# Patient Record
Sex: Female | Born: 1955 | Race: Black or African American | Hispanic: No | Marital: Married | Smoking: Never smoker
Health system: Southern US, Community
[De-identification: ages and names within clinical notes are randomized; demographics above are authoritative.]

## PROBLEM LIST (undated history)

## (undated) DIAGNOSIS — I10 Essential (primary) hypertension: Secondary | ICD-10-CM

## (undated) DIAGNOSIS — E785 Hyperlipidemia, unspecified: Secondary | ICD-10-CM

## (undated) DIAGNOSIS — I6529 Occlusion and stenosis of unspecified carotid artery: Secondary | ICD-10-CM

## (undated) DIAGNOSIS — R931 Abnormal findings on diagnostic imaging of heart and coronary circulation: Secondary | ICD-10-CM

## (undated) HISTORY — PX: CHOLECYSTECTOMY: SHX55

## (undated) HISTORY — DX: Occlusion and stenosis of unspecified carotid artery: I65.29

## (undated) HISTORY — PX: LUNG BIOPSY: SHX232

## (undated) HISTORY — PX: ABDOMINAL HYSTERECTOMY: SHX81

## (undated) HISTORY — PX: TUBAL LIGATION: SHX77

## (undated) HISTORY — PX: CARDIAC DEFIBRILLATOR PLACEMENT: SHX171

---

## 2012-04-16 ENCOUNTER — Other Ambulatory Visit: Payer: Self-pay | Admitting: Orthopedic Surgery

## 2012-04-16 DIAGNOSIS — M5104 Intervertebral disc disorders with myelopathy, thoracic region: Secondary | ICD-10-CM

## 2012-04-22 ENCOUNTER — Ambulatory Visit
Admission: RE | Admit: 2012-04-22 | Discharge: 2012-04-22 | Disposition: A | Discharge status: Home / Self Care | Payer: Medicare Other | Source: Ambulatory Visit | Attending: Orthopedic Surgery | Admitting: Orthopedic Surgery

## 2012-04-22 VITALS — BP 145/75 | HR 67

## 2012-04-22 DIAGNOSIS — M5104 Intervertebral disc disorders with myelopathy, thoracic region: Secondary | ICD-10-CM

## 2012-04-22 MED ORDER — IOHEXOL 180 MG/ML  SOLN
20.0000 mL | Freq: Once | INTRAMUSCULAR | Status: AC | PRN
  Administered 2012-04-22: 20 mL via INTRATHECAL

## 2012-04-22 MED ORDER — MEPERIDINE HCL 100 MG/ML IJ SOLN
75.0000 mg | Freq: Once | INTRAMUSCULAR | Status: AC
  Administered 2012-04-22: 75 mg via INTRAMUSCULAR

## 2012-04-22 MED ORDER — ONDANSETRON HCL 4 MG/2ML IJ SOLN
4.0000 mg | Freq: Once | INTRAMUSCULAR | Status: AC
  Administered 2012-04-22: 4 mg via INTRAMUSCULAR

## 2012-04-22 MED ORDER — DIAZEPAM 5 MG PO TABS
10.0000 mg | ORAL_TABLET | Freq: Once | ORAL | Status: AC
  Administered 2012-04-22: 10 mg via ORAL

## 2016-01-23 ENCOUNTER — Emergency Department (HOSPITAL_COMMUNITY)
Admission: EM | Admit: 2016-01-23 | Discharge: 2016-01-23 | Disposition: A | Discharge status: Home / Self Care | Payer: Medicare HMO | Attending: Emergency Medicine | Admitting: Emergency Medicine

## 2016-01-23 ENCOUNTER — Encounter (HOSPITAL_COMMUNITY): Payer: Self-pay | Admitting: Emergency Medicine

## 2016-01-23 DIAGNOSIS — M79671 Pain in right foot: Secondary | ICD-10-CM | POA: Diagnosis present

## 2016-01-23 DIAGNOSIS — I10 Essential (primary) hypertension: Secondary | ICD-10-CM | POA: Diagnosis not present

## 2016-01-23 DIAGNOSIS — L84 Corns and callosities: Secondary | ICD-10-CM | POA: Insufficient documentation

## 2016-01-23 DIAGNOSIS — M79672 Pain in left foot: Secondary | ICD-10-CM

## 2016-01-23 HISTORY — DX: Essential (primary) hypertension: I10

## 2016-01-23 MED ORDER — UREA 47 % EX CREA: 1.0000 "application " | TOPICAL_CREAM | Freq: Two times a day (BID) | CUTANEOUS | 0 refills | Status: DC

## 2016-01-23 NOTE — ED Triage Notes (Signed)
Pt c/o bilateral "nerve pain" in her feet. Pt denies pmh of neuropathy. No injury reported.

## 2016-01-23 NOTE — ED Provider Notes (Signed)
[facility]-EMERGENCY DEPT Provider Note   CSN: [id number] Arrival date & time: 01/23/16  1307  By signing my name below, I, Soijett Blue, attest that this documentation has been prepared under the direction and in the presence of Burgess AmorJulie Cola Highfill, PA-C Electronically Signed: Soijett Blue, ED Scribe. 01/23/16. 3:17 PM.    History   Chief Complaint Chief Complaint  Patient presents with  . Foot Pain    HPI Donna Tran is a 60 y.o. female with a PMHx of HTN, who presents to the Emergency Department complaining of bilateral foot pain onset 2 weeks. Pt notes that she has intermittent sharp "nerve pain" without burning sensation to the ball of her bilateral feet but also endorses reduced sensation to the bottom of her feet along with scaling and constant chapped skin on her feet despite getting routine pedicures. Pt denies  alleviating factors. Pt reports that she does stand on her feet a lot.  She denies color change, wound, rash, swelling, and any other symptoms. Pt has been seen by her pcp for this and was checked for diabetes and does not have this problem. Pt PCP is Dr. Merleen MillinerWinfield in [place], [place]. 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 .   The history is provided by the patient. No language interpreter was used.    Past Medical History:  Diagnosis Date  . Hypertension     There are no active problems to display for this patient.   Past Surgical History:  Procedure Laterality Date  . ABDOMINAL HYSTERECTOMY    . CARDIAC DEFIBRILLATOR PLACEMENT    . CHOLECYSTECTOMY    . LUNG BIOPSY    . TUBAL LIGATION      OB History    No data available       Home Medications    Prior to Admission medications   Medication Sig Start Date End Date Taking? Authorizing Provider  Urea 47 % CREA Apply 1 application topically 2 (two) times daily. 01/23/16   Burgess AmorJulie Sae Handrich, PA-C    Family History No family history on  file.  Social History Social History  Substance Use Topics  . Smoking status: Never Smoker  . Smokeless tobacco: Never Used  . Alcohol use No     Allergies   Sulfa antibiotics   Review of Systems Review of Systems  Constitutional: Negative for fever.  Cardiovascular: Negative for leg swelling.  Musculoskeletal: Positive for arthralgias (bilateral feet). Negative for back pain and joint swelling.  Skin: Negative for color change, rash and wound.       Negative except as mentioned in HPI.      Physical Exam Updated Vital Signs BP 127/67 (BP Location: Left Arm)   Pulse (!) 55   Temp 97.9 F (36.6 C) (Oral)   Resp 18   Ht 5' 8.5" (1.74 m)   Wt 108.9 kg   SpO2 100%   BMI 35.96 kg/m   Physical Exam  Constitutional: She appears well-developed and well-nourished.  HENT:  Head: Atraumatic.  Neck: Normal range of motion.  Cardiovascular:  Pulses:      Dorsalis pedis pulses are 2+ on the right side, and 2+ on the left side.  Pulses equal bilaterally.  Less than 2 sec cap refill in toes.  Musculoskeletal: She exhibits tenderness.  Advanced callus formation bilaterally along the plantar metatarsals and bilateral great toe at MTP joints. skin appears chapped and hyperkeratotic. Skin is intact.  No lesions or rash between toes. Cap refill in toes less than 2 seconds. No deformity.   Feet:  Right Foot:  Skin Integrity: Positive for callus and dry skin. Negative for skin breakdown or erythema.  Left Foot:  Skin Integrity: Positive for callus and dry skin. Negative for skin breakdown or erythema.  Neurological: She is alert. She displays normal reflexes. No sensory deficit.  Skin: Skin is warm, dry and intact. Capillary refill takes less than 2 seconds. No lesion and no rash noted.  Psychiatric: She has a normal mood and affect.     ED Treatments / Results  DIAGNOSTIC STUDIES: Oxygen Saturation is 100% on RA, nl by my interpretation.    COORDINATION OF CARE: 3:09 PM  Discussed treatment plan with pt at bedside which includes referral to podiatrist and pt agreed to plan.   Procedures Procedures (including critical care time)  Medications Ordered in ED Medications - No data to display   Initial Impression / Assessment and Plan / ED Course  I have reviewed the triage vital signs and the nursing notes.   Clinical Course    Pt with bilateral foot pain of unclear etiology.  No vascular or neuro deficits on exam.  She has unusually thickened and hyperkeratotic skin plantar and along great toe mtp's.  Prescribed keratolytic agent.  Referral to podiatry for further eval.  And tx.  Final Clinical Impressions(s) / ED Diagnoses   Final diagnoses:  Foot pain, bilateral  Corns and callus    New Prescriptions Discharge Medication List as of 01/23/2016  3:23 PM    START taking these medications   Details  Urea 47 % CREA Apply 1 application topically 2 (two) times daily., Starting Mon 01/23/2016, Print        I personally performed the services described in this documentation, which was scribed in my presence. The recorded information has been reviewed and is accurate.     Burgess Amor, PA-C 01/25/16 1851    Loren Racer, MD 01/30/16 1640

## 2016-11-15 ENCOUNTER — Other Ambulatory Visit: Payer: Self-pay | Admitting: Orthopedic Surgery

## 2016-11-15 DIAGNOSIS — M25511 Pain in right shoulder: Secondary | ICD-10-CM

## 2016-11-21 ENCOUNTER — Other Ambulatory Visit: Payer: Medicare HMO

## 2016-11-27 ENCOUNTER — Other Ambulatory Visit: Payer: Medicare HMO

## 2016-12-04 ENCOUNTER — Ambulatory Visit
Admission: RE | Admit: 2016-12-04 | Discharge: 2016-12-04 | Disposition: A | Discharge status: Home / Self Care | Payer: Medicare HMO | Source: Ambulatory Visit | Attending: Orthopedic Surgery | Admitting: Orthopedic Surgery

## 2016-12-04 DIAGNOSIS — M25511 Pain in right shoulder: Secondary | ICD-10-CM

## 2017-08-03 ENCOUNTER — Other Ambulatory Visit: Payer: Self-pay

## 2017-08-03 ENCOUNTER — Emergency Department (HOSPITAL_COMMUNITY)
Admission: EM | Admit: 2017-08-03 | Discharge: 2017-08-03 | Disposition: A | Discharge status: Home / Self Care | Payer: Medicare HMO | Attending: Emergency Medicine | Admitting: Emergency Medicine

## 2017-08-03 ENCOUNTER — Encounter (HOSPITAL_COMMUNITY): Payer: Self-pay | Admitting: Emergency Medicine

## 2017-08-03 ENCOUNTER — Emergency Department (HOSPITAL_COMMUNITY): Payer: Medicare HMO

## 2017-08-03 DIAGNOSIS — R0602 Shortness of breath: Secondary | ICD-10-CM | POA: Diagnosis present

## 2017-08-03 DIAGNOSIS — Z79899 Other long term (current) drug therapy: Secondary | ICD-10-CM | POA: Insufficient documentation

## 2017-08-03 DIAGNOSIS — I10 Essential (primary) hypertension: Secondary | ICD-10-CM | POA: Insufficient documentation

## 2017-08-03 DIAGNOSIS — R0789 Other chest pain: Secondary | ICD-10-CM | POA: Insufficient documentation

## 2017-08-03 DIAGNOSIS — D649 Anemia, unspecified: Secondary | ICD-10-CM | POA: Diagnosis not present

## 2017-08-03 DIAGNOSIS — I509 Heart failure, unspecified: Secondary | ICD-10-CM | POA: Diagnosis not present

## 2017-08-03 DIAGNOSIS — R600 Localized edema: Secondary | ICD-10-CM | POA: Insufficient documentation

## 2017-08-03 DIAGNOSIS — M7989 Other specified soft tissue disorders: Secondary | ICD-10-CM | POA: Diagnosis not present

## 2017-08-03 HISTORY — DX: Abnormal findings on diagnostic imaging of heart and coronary circulation: R93.1

## 2017-08-03 HISTORY — DX: Hyperlipidemia, unspecified: E78.5

## 2017-08-03 LAB — BRAIN NATRIURETIC PEPTIDE: B Natriuretic Peptide: 227 pg/mL — ABNORMAL HIGH (ref 0.0–100.0)

## 2017-08-03 LAB — TROPONIN I: Troponin I: <0.03 ng/mL (ref <0.03)

## 2017-08-03 LAB — CBC
HCT: 33.5 % — ABNORMAL LOW (ref 36.0–46.0)
Hemoglobin: 10.7 g/dL — ABNORMAL LOW (ref 12.0–15.0)
MCH: 29.2 pg (ref 26.0–34.0)
MCHC: 31.9 g/dL (ref 30.0–36.0)
MCV: 91.5 fL (ref 78.0–100.0)
Platelets: 224 10*3/uL (ref 150–400)
RBC: 3.66 MIL/uL — ABNORMAL LOW (ref 3.87–5.11)
RDW: 14.2 % (ref 11.5–15.5)
WBC: 6.4 10*3/uL (ref 4.0–10.5)

## 2017-08-03 LAB — BASIC METABOLIC PANEL
Anion gap: 9 (ref 5–15)
BUN: 14 mg/dL (ref 6–20)
CO2: 28 mmol/L (ref 22–32)
Calcium: 8.9 mg/dL (ref 8.9–10.3)
Chloride: 103 mmol/L (ref 101–111)
Creatinine, Ser: 0.74 mg/dL (ref 0.44–1.00)
GFR calc Af Amer: >60 mL/min (ref >60)
GFR calc non Af Amer: >60 mL/min (ref >60)
Glucose, Bld: 102 mg/dL — ABNORMAL HIGH (ref 65–99)
Potassium: 3.6 mmol/L (ref 3.5–5.1)
Sodium: 140 mmol/L (ref 135–145)

## 2017-08-03 LAB — D-DIMER, QUANTITATIVE: D-Dimer, Quant: 1.27 ug/mL-FEU — ABNORMAL HIGH (ref 0.00–0.50)

## 2017-08-03 MED ORDER — RIVAROXABAN 15 MG PO TABS
15.0000 mg | ORAL_TABLET | Freq: Once | ORAL | Status: AC
  Administered 2017-08-03: 15 mg via ORAL
  Filled 2017-08-03 (×2): qty 1

## 2017-08-03 MED ORDER — FUROSEMIDE 40 MG PO TABS: 40.0000 mg | ORAL_TABLET | Freq: Every day | ORAL | 0 refills | Status: AC | PRN

## 2017-08-03 MED ORDER — FUROSEMIDE 40 MG PO TABS
20.0000 mg | ORAL_TABLET | Freq: Once | ORAL | Status: AC
  Administered 2017-08-03: 20 mg via ORAL
  Filled 2017-08-03: qty 1

## 2017-08-03 MED ORDER — IOPAMIDOL (ISOVUE-370) INJECTION 76%
100.0000 mL | Freq: Once | INTRAVENOUS | Status: AC | PRN
  Administered 2017-08-03: 100 mL via INTRAVENOUS

## 2017-08-03 NOTE — ED Provider Notes (Signed)
[facility] EMERGENCY DEPARTMENT Provider Note   CSN: [id number] Arrival date & time: 08/03/17  1755     History   Chief Complaint Chief Complaint  Patient presents with  . Shortness of Breath    HPI Donna Tran is a 62 y.o. female.  HPI Patient presents to the emergency room for evaluation of shortness of breath and leg swelling.  Patient states she has been traveling recently.  Several hours time in a car.  Over the last several days she has noticed shortness of breath.  Specifically when she is walking around or is active.  She has had some mild chest tightness off and on for the last few days.  She also noticed unilateral, not bilateral lower extremity swelling.  She has noticed in her left leg.  Today however it is a bit better.  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  Patient states that her cardiomyopathy eventually improved and she does not take any diuretics daily.  Does have a defibrillator in place.  She denies any history of PE or DVT.  She denies any fevers or coughing.  No abdominal pain or other complaints. Past Medical History:  Diagnosis Date  . Decreased cardiac ejection fraction   . Hyperlipidemia   . Hypertension     There are no active problems to display for this patient.   Past Surgical History:  Procedure Laterality Date  . ABDOMINAL HYSTERECTOMY    . CARDIAC DEFIBRILLATOR PLACEMENT    . CARDIAC DEFIBRILLATOR PLACEMENT    . CHOLECYSTECTOMY    . LUNG BIOPSY    . TUBAL LIGATION       OB History   None      Home Medications    Prior to Admission medications   Medication Sig Start Date End Date Taking? Authorizing Provider  carvedilol (COREG) 25 MG tablet Take 25 mg by mouth 2 (two) times daily. 01/24/15  Yes [provider]  cholecalciferol (VITAMIN D) 1000 units tablet Vitamin D3 1,000 unit capsule  Take 1 capsule every day by oral route as directed for 90 days. 06/07/17  Yes [provider]  ferrous  sulfate 325 (65 FE) MG EC tablet Take 325 mg by mouth daily with breakfast.    Yes [provider]  fluticasone (FLONASE) 50 MCG/ACT nasal spray Place 1 spray into both nostrils daily as needed for allergies or rhinitis.  03/19/16  Yes [provider]  hydrochlorothiazide (HYDRODIURIL) 25 MG tablet Take 25 mg by mouth daily. 08/23/16  Yes [provider]  ibuprofen (ADVIL,MOTRIN) 200 MG tablet TAKE 1 TABLET BY MOUTH EVERY 6 HOURS AS NEEDED FOR PAIN CONTROL 09/26/15  Yes [provider]  metoprolol tartrate (LOPRESSOR) 25 MG tablet Take 25 mg by mouth daily as needed. For palpitations 02/08/17  Yes [provider]  quinapril (ACCUPRIL) 40 MG tablet Take 40 mg by mouth daily.   Yes [provider]  triamcinolone cream (KENALOG) 0.1 % APPLY ONE APPLICATION TOPICALLY DAILY AS NEEDED FOR IRRITATION 06/07/17  Yes [provider]  furosemide (LASIX) 40 MG tablet Take 1 tablet (40 mg total) by mouth daily as needed for fluid. 08/03/17   Linwood Dibbles, MD    Family History No family history on file.  Social History Social History   Tobacco Use  . Smoking status: Never Smoker  . Smokeless tobacco: Never Used  Substance Use Topics  . Alcohol use: No  . Drug use: Never     Allergies  Sulfa antibiotics   Review of Systems Review of Systems  Constitutional: Negative for fever.  Gastrointestinal: Negative for blood in stool, nausea and vomiting.  Genitourinary: Negative for dysuria.  All other systems reviewed and are negative.    Physical Exam Updated Vital Signs BP (!) 138/93   Pulse 68   Resp 19   Ht 1.727 m (5\' 8" )   Wt 109.8 kg (242 lb)   SpO2 100%   BMI 36.80 kg/m   Physical Exam  Constitutional: She appears well-developed and well-nourished.  Non-toxic appearance. She does not appear ill. No distress.  HENT:  Head: Normocephalic and atraumatic.  Right Ear: External ear normal.  Left Ear: External ear normal.  Eyes:  Conjunctivae are normal. Right eye exhibits no discharge. Left eye exhibits no discharge. No scleral icterus.  Neck: Neck supple. No tracheal deviation present.  Cardiovascular: Normal rate, regular rhythm and intact distal pulses.  Pulmonary/Chest: Effort normal and breath sounds normal. No stridor. No respiratory distress. She has no wheezes. She has no rales.  Abdominal: Soft. Bowel sounds are normal. She exhibits no distension. There is no tenderness. There is no rebound and no guarding.  Musculoskeletal: Normal range of motion. She exhibits no tenderness.       Right lower leg: She exhibits no edema.       Left lower leg: She exhibits edema (mild). She exhibits no tenderness.  Neurological: She is alert. She has normal strength. No cranial nerve deficit (no facial droop, extraocular movements intact, no slurred speech) or sensory deficit. She exhibits normal muscle tone. She displays no seizure activity. Coordination normal.  Skin: Skin is warm and dry. No rash noted.  Psychiatric: She has a normal mood and affect.  Nursing note and vitals reviewed.    ED Treatments / Results  Labs (all labs ordered are listed, but only abnormal results are displayed) Labs Reviewed  BASIC METABOLIC PANEL - Abnormal; Notable for the following components:      Result Value   Glucose, Bld 102 (*)    All other components within normal limits  CBC - Abnormal; Notable for the following components:   RBC 3.66 (*)    Hemoglobin 10.7 (*)    HCT 33.5 (*)    All other components within normal limits  BRAIN NATRIURETIC PEPTIDE - Abnormal; Notable for the following components:   B Natriuretic Peptide 227.0 (*)    All other components within normal limits  D-DIMER, QUANTITATIVE (NOT AT [facility]) - Abnormal; Notable for the following components:   D-Dimer, Quant 1.27 (*)    All other components within normal limits  TROPONIN I    EKG EKG Interpretation  Date/Time:  Saturday August 03 2017 18:06:02  EDT Ventricular Rate:  75 PR Interval:    QRS Duration: 156 QT Interval:  505 QTC Calculation: 565 R Axis:   159 Text Interpretation:  Sinus rhythm Atrial premature complex Prolonged PR interval Probable left atrial enlargement Anterior infarct, old Prolonged QT interval No old tracing to compare Confirmed by Linwood Dibbles [phone]) on 08/03/2017 6:11:21 PM   Radiology Dg Chest 2 View  Result Date: 08/03/2017 CLINICAL DATA:  Chest pain and shortness of breath. EXAM: CHEST - 2 VIEW COMPARISON:  None. FINDINGS: Dual lead cardiac pacemaker with leads overlying right atrium and right ventricle. Mildly enlarged cardiac silhouette. Mediastinal contours appear intact. Interstitial pulmonary edema. Bilateral small pleural effusions. Probable underlying emphysema. Osseous structures are without acute abnormality. Soft tissues are grossly normal. IMPRESSION: Enlarged cardiac silhouette with  interstitial pulmonary edema and small bilateral pleural effusions. Probable underlying emphysema. Electronically Signed   By: Ted Mcalpineobrinka  Dimitrova M.D.   On: 08/03/2017 19:17   Ct Angio Chest Pe W And/or Wo Contrast  Result Date: 08/03/2017 CLINICAL DATA:  Shortness of breath with exertion and at rest. EXAM: CT ANGIOGRAPHY CHEST WITH CONTRAST TECHNIQUE: Multidetector CT imaging of the chest was performed using the standard protocol during bolus administration of intravenous contrast. Multiplanar CT image reconstructions and MIPs were obtained to evaluate the vascular anatomy. CONTRAST:  100mL ISOVUE-370 IOPAMIDOL (ISOVUE-370) INJECTION 76% COMPARISON:  Chest radiograph 08/03/2017 FINDINGS: Cardiovascular: Satisfactory opacification of the pulmonary arteries to the segmental level. No evidence of pulmonary embolism. Mildly enlarged heart. Dual lead cardiac pacemaker. No pericardial effusion. Mediastinum/Nodes: No enlarged mediastinal, hilar, or axillary lymph nodes. Thyroid gland, trachea, and esophagus demonstrate no significant  findings. Lungs/Pleura: Bilateral, right greater than left, lobe airspace consolidation versus atelectasis. Small bilateral pleural effusions. Upper Abdomen: No acute abnormality. Musculoskeletal: No chest wall abnormality. No acute or significant osseous findings. Review of the MIP images confirms the above findings. IMPRESSION: No evidence of pulmonary embolus or other acute vascular abnormality. Mildly enlarged heart. Small bilateral pleural effusions. Bilateral lung, right greater than left, dependent airspace consolidation versus atelectasis. Electronically Signed   By: Ted Mcalpineobrinka  Dimitrova M.D.   On: 08/03/2017 21:05    Procedures Procedures (including critical care time)  Medications Ordered in ED Medications  Rivaroxaban (XARELTO) tablet 15 mg (has no administration in time range)  furosemide (LASIX) tablet 20 mg (has no administration in time range)  iopamidol (ISOVUE-370) 76 % injection 100 mL (100 mLs Intravenous Contrast Given 08/03/17 2021)     Initial Impression / Assessment and Plan / ED Course  I have reviewed the triage vital signs and the nursing notes.  Pertinent labs & imaging results that were available during my care of the patient were reviewed by me and considered in my medical decision making (see chart for details).  Clinical Course as of Aug 04 2146  Sat Aug 03, 2017  1901 D dimer elevated.  Will CT to evaluate further   [JK]    Clinical Course User Index [JK] Linwood DibblesKnapp, Zariyah Stephens, MD    The patient presented to the emergency room for evaluation of mild shortness of breath and leg swelling.  Patient's laboratory tests were notable for an elevated d-dimer.  She had a mild anemia but I do not think this is clinically significant.  She is not having any symptoms to suggest active bleeding.  She is a slight increase in her BNP and chest x-ray suggested mild pulmonary edema.  Pt is breathing easily.  Comfortable with going home.  Will start her on oral diuretic and close follow  up with cardiologist this week.  CT scan negative for PE, but will have pt return tomorrow to have a ultrasound of her leg.  Empiric xarelto dose given in the ED Final Clinical Impressions(s) / ED Diagnoses   Final diagnoses:  Leg swelling  Anemia, unspecified type  Congestive heart failure, unspecified HF chronicity, unspecified heart failure type [facility](HCC)    ED Discharge Orders        Ordered    furosemide (LASIX) 40 MG tablet  Daily PRN     08/03/17 2146    US Venous Img Lower Unilateral Left     08/03/17 2147       Linwood DibblesKnapp, Juley Giovanetti, MD 08/03/17 2148

## 2017-08-03 NOTE — ED Triage Notes (Signed)
Pt c/o dyspnea with exertion, bilateral lower extremity edema, and chest tightness x 2-3 days. Pt with defibrillator for low EF. States she takes Lasix as needed. Denies hx of CHF.

## 2017-08-03 NOTE — Discharge Instructions (Signed)
Follow-up with your cardiologist this week, call to schedule a follow-up appointment, return to the full tomorrow morning to have the ultrasound of your leg.  Return to the emergency room as needed for worsening symptoms.  Start taking your Lasix daily for the next several days.

## 2017-08-04 ENCOUNTER — Ambulatory Visit (HOSPITAL_COMMUNITY)
Admit: 2017-08-04 | Discharge: 2017-08-04 | Disposition: A | Discharge status: Home / Self Care | Payer: Medicare HMO | Source: Ambulatory Visit | Attending: Emergency Medicine | Admitting: Emergency Medicine

## 2017-08-04 DIAGNOSIS — M7989 Other specified soft tissue disorders: Secondary | ICD-10-CM | POA: Diagnosis present

## 2017-08-04 DIAGNOSIS — R7989 Other specified abnormal findings of blood chemistry: Secondary | ICD-10-CM | POA: Insufficient documentation

## 2017-08-04 NOTE — ED Provider Notes (Signed)
  1010  patient returned today for scheduled out patient Venous US of left lower extremity.  No evidence of DVT.  Pt informed, agrees to PCP follow-up    Pauline Ausriplett, Basem Yannuzzi, PA-C 08/04/17 1014    Samuel JesterMcManus, Kathleen, DO 08/04/17 1452

## 2019-03-26 IMAGING — US US EXTREM LOW VENOUS*L*
1 series · 13 of 24 positions shown · non-contrast
Comparison: None.

CLINICAL DATA: Left lower leg pain for 3 days



[Series 1: us extrem low venous*left* · 0.07mm/px · 13 of 32 slices shown]
[im 1/32]
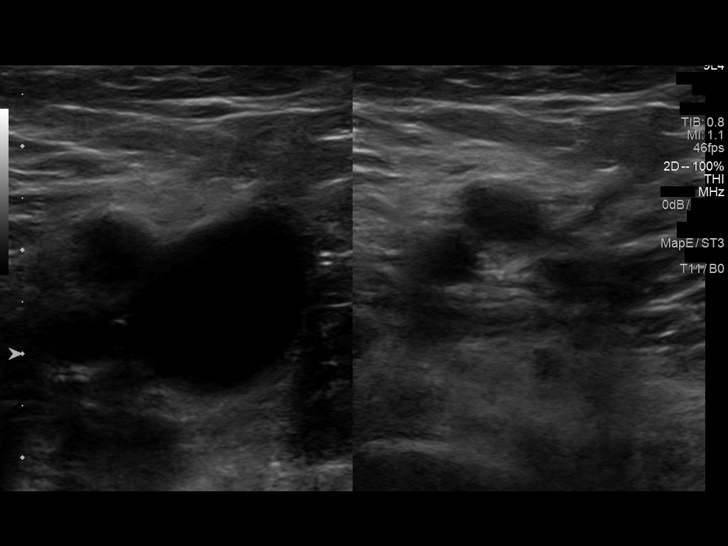
[im 3/32]
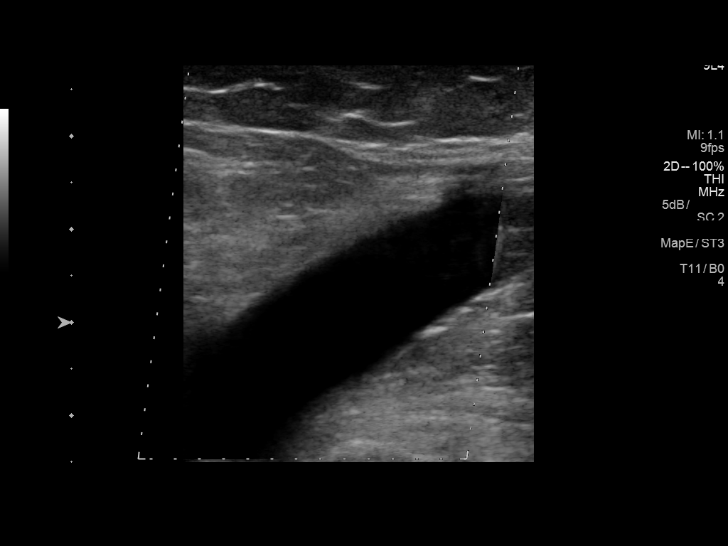
[im 6/32]
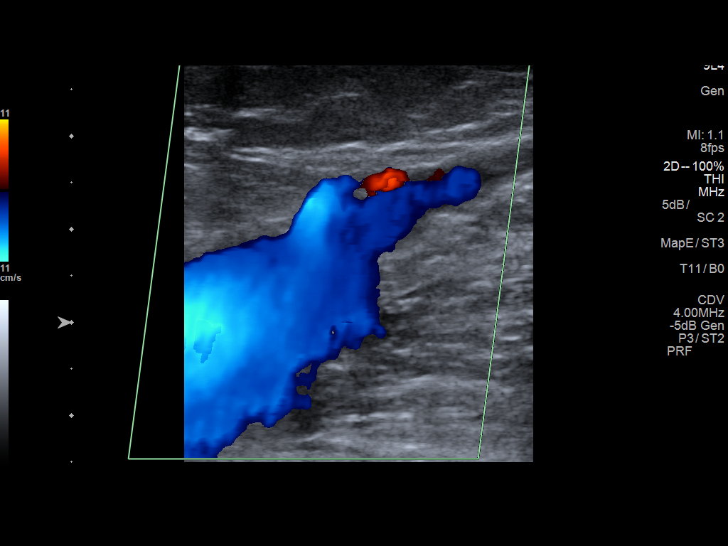
[im 9/32]
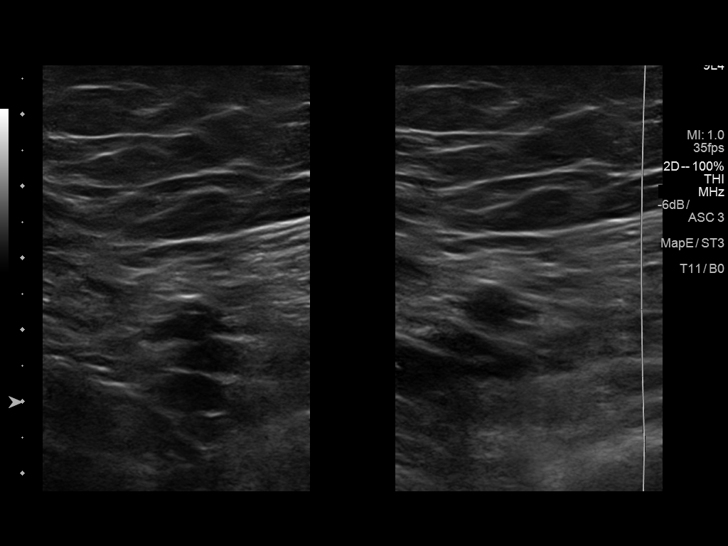
[im 11/32]
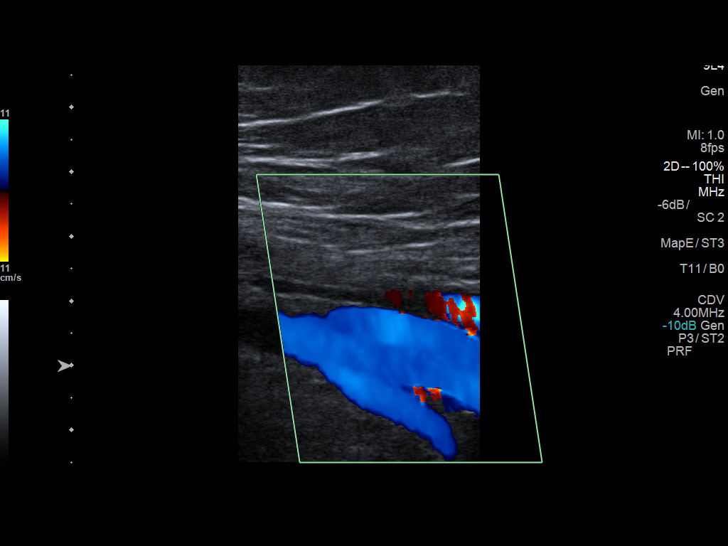
[im 14/32]
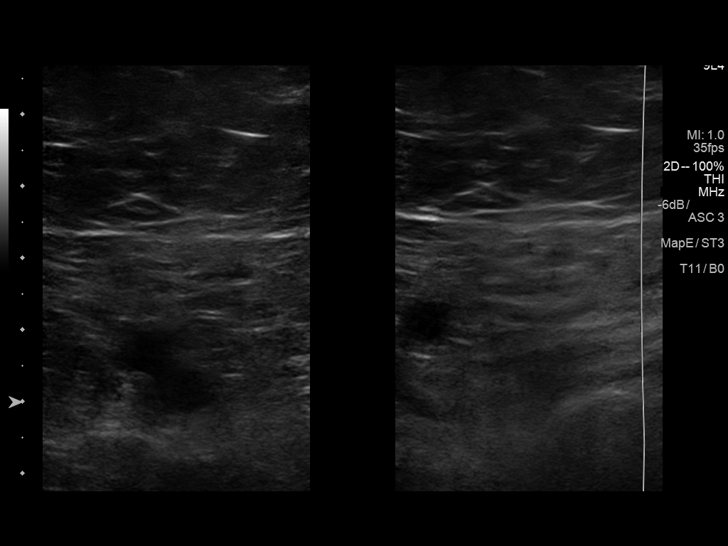
[im 17/32]
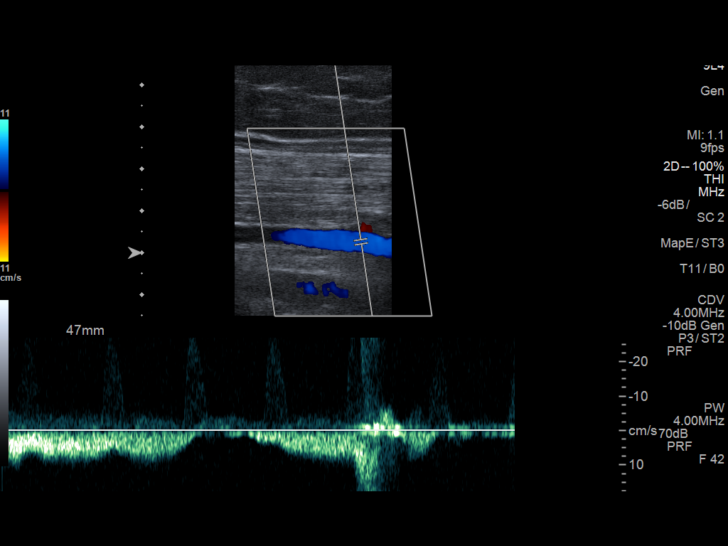
[im 18/32]
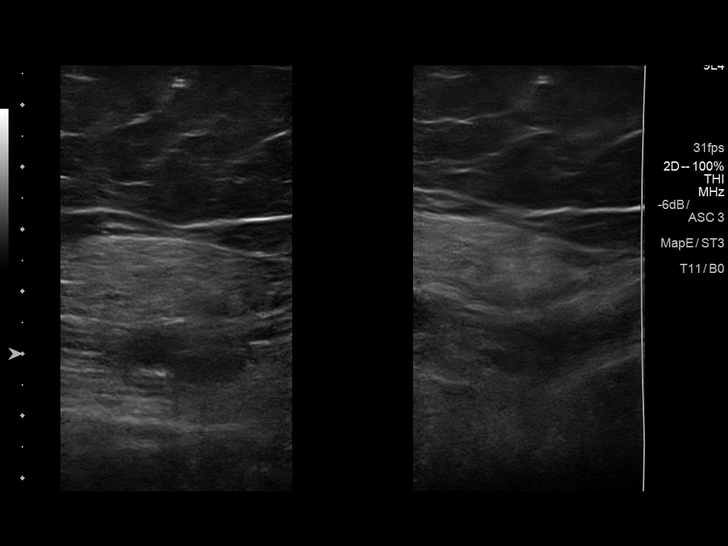
[im 21/32]
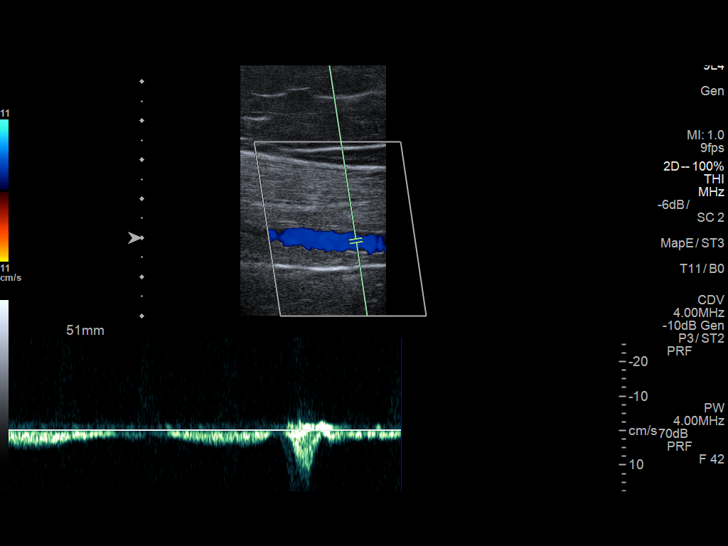
[im 23/32]
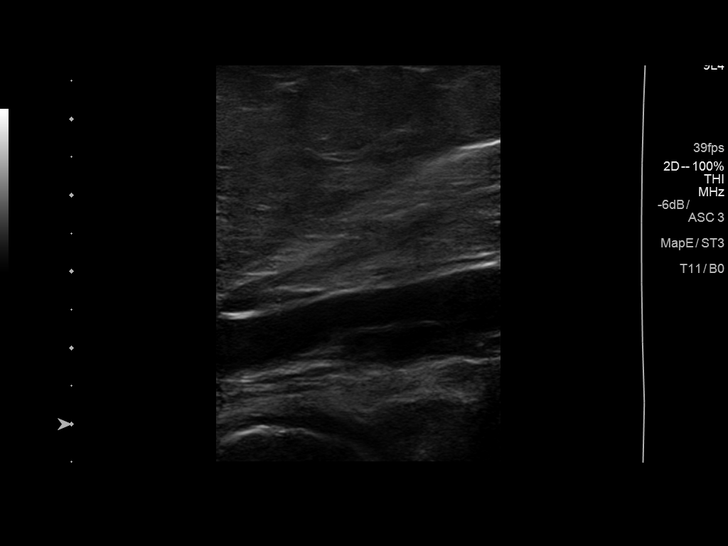
[im 26/32]
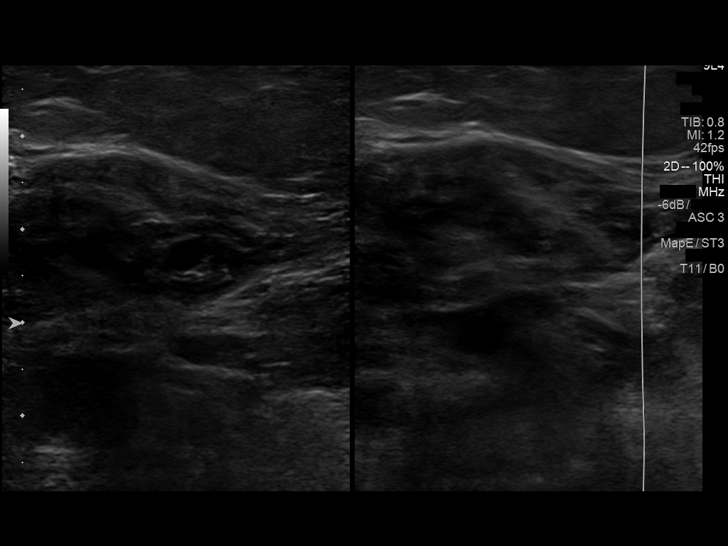
[im 29/32]
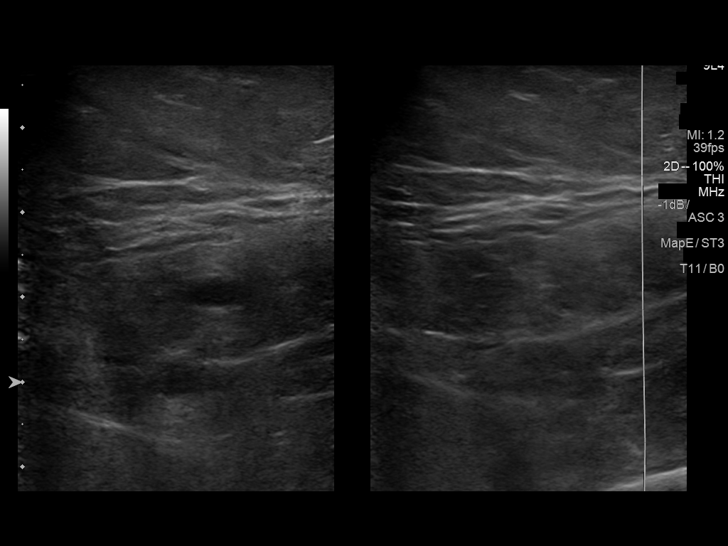
[im 32/32]
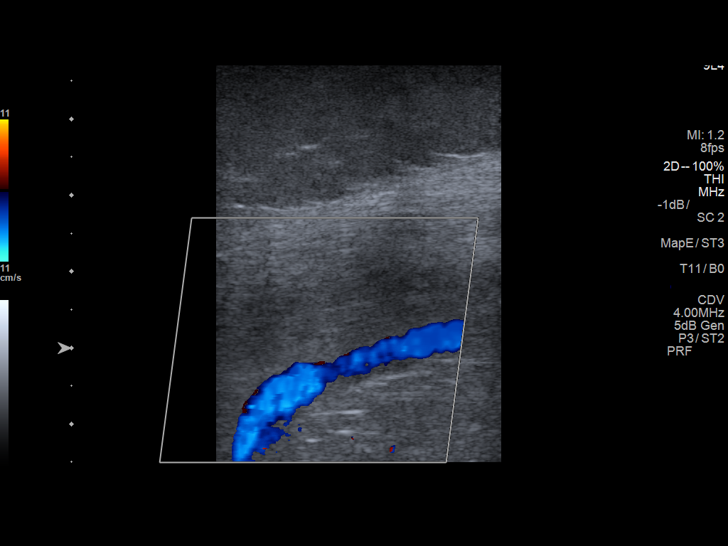

[13 of 24 positions shown; findings below may reference images not displayed]

FINDINGS: Contralateral Common Femoral Vein: Respiratory phasicity is normal
and symmetric with the symptomatic side. No evidence of thrombus.
Normal compressibility.

Common Femoral Vein: No evidence of thrombus. Normal
compressibility, respiratory phasicity and response to augmentation.

Saphenofemoral Junction: No evidence of thrombus. Normal
compressibility and flow on color Doppler imaging.

Profunda Femoral Vein: No evidence of thrombus. Normal
compressibility and flow on color Doppler imaging.

Femoral Vein: No evidence of thrombus. Normal compressibility,
respiratory phasicity and response to augmentation.

Popliteal Vein: No evidence of thrombus. Normal compressibility,
respiratory phasicity and response to augmentation.

Calf Veins: No evidence of thrombus. Normal compressibility and flow
on color Doppler imaging.

Superficial Great Saphenous Vein: No evidence of thrombus. Normal
compressibility.

Venous Reflux:  None.

Other Findings:  None.
IMPRESSION: No evidence of deep venous thrombosis.

## 2019-09-04 ENCOUNTER — Other Ambulatory Visit (HOSPITAL_COMMUNITY): Payer: Self-pay | Admitting: Family Medicine

## 2019-09-04 DIAGNOSIS — R0989 Other specified symptoms and signs involving the circulatory and respiratory systems: Secondary | ICD-10-CM

## 2019-09-09 ENCOUNTER — Other Ambulatory Visit: Payer: Self-pay

## 2019-09-09 ENCOUNTER — Ambulatory Visit (HOSPITAL_COMMUNITY)
Admission: RE | Admit: 2019-09-09 | Discharge: 2019-09-09 | Disposition: A | Discharge status: Home / Self Care | Payer: Medicare Other | Source: Ambulatory Visit | Attending: Family Medicine | Admitting: Family Medicine

## 2019-09-09 DIAGNOSIS — R0989 Other specified symptoms and signs involving the circulatory and respiratory systems: Secondary | ICD-10-CM | POA: Insufficient documentation

## 2019-09-09 NOTE — Progress Notes (Signed)
Carotid duplex has been completed.   Preliminary results in CV Proc.   Blanch Media 09/09/2019 10:16 AM

## 2019-10-26 ENCOUNTER — Other Ambulatory Visit: Payer: Self-pay | Admitting: *Deleted

## 2019-10-26 DIAGNOSIS — R0989 Other specified symptoms and signs involving the circulatory and respiratory systems: Secondary | ICD-10-CM

## 2019-11-05 ENCOUNTER — Encounter: Payer: Medicare Other | Admitting: Vascular Surgery

## 2019-11-05 ENCOUNTER — Encounter (HOSPITAL_COMMUNITY): Payer: Medicare Other

## 2019-12-09 ENCOUNTER — Other Ambulatory Visit: Payer: Self-pay

## 2019-12-09 DIAGNOSIS — I6529 Occlusion and stenosis of unspecified carotid artery: Secondary | ICD-10-CM

## 2019-12-24 ENCOUNTER — Ambulatory Visit (HOSPITAL_COMMUNITY)
Admission: RE | Admit: 2019-12-24 | Discharge: 2019-12-24 | Disposition: A | Discharge status: Home / Self Care | Payer: Medicare PPO | Source: Ambulatory Visit | Attending: Vascular Surgery | Admitting: Vascular Surgery

## 2019-12-24 ENCOUNTER — Encounter: Payer: Self-pay | Admitting: Vascular Surgery

## 2019-12-24 ENCOUNTER — Ambulatory Visit: Payer: Medicare PPO | Admitting: Vascular Surgery

## 2019-12-24 ENCOUNTER — Other Ambulatory Visit: Payer: Self-pay

## 2019-12-24 VITALS — BP 118/75 | HR 72 | Temp 98.0°F | Resp 20 | Ht 68.0 in | Wt 241.0 lb

## 2019-12-24 DIAGNOSIS — I6529 Occlusion and stenosis of unspecified carotid artery: Secondary | ICD-10-CM | POA: Insufficient documentation

## 2019-12-24 DIAGNOSIS — I6522 Occlusion and stenosis of left carotid artery: Secondary | ICD-10-CM

## 2019-12-24 MED ORDER — ASPIRIN EC 81 MG PO TBEC: 81.0000 mg | DELAYED_RELEASE_TABLET | Freq: Every day | ORAL | 2 refills | Status: AC

## 2019-12-24 NOTE — Progress Notes (Signed)
Referring Physician: Renaldo Harrison  Patient name: Donna Tran MRN: [id number] DOB: [birth year] Sex: female  REASON FOR CONSULT: Left greater than 80% carotid stenosis  HPI: Donna Tran is a 64 y.o. female, furred for evaluation of left carotid stenosis detected with duplex after asymptomatic left carotid bruit.  Patient has never had a TIA amaurosis or stroke event.  She currently is on an antilipid agent but is not on aspirin.  She has a history also of hypertension.  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  Her cardiologist is Dr. Daryel November.  She states she had a cardiac catheterization in the past that did not require any intervention.  Other medical problems include hyperlipidemia and hypertension both of which are currently stable.  Past Medical History:  Diagnosis Date  . Carotid artery occlusion   . Decreased cardiac ejection fraction   . Hyperlipidemia   . Hypertension    Past Surgical History:  Procedure Laterality Date  . ABDOMINAL HYSTERECTOMY    . CARDIAC DEFIBRILLATOR PLACEMENT    . CARDIAC DEFIBRILLATOR PLACEMENT    . CHOLECYSTECTOMY    . LUNG BIOPSY    . TUBAL LIGATION      History reviewed. No pertinent family history.  SOCIAL HISTORY: Social History   Socioeconomic History  . Marital status: Married    Spouse name: Not on file  . Number of children: Not on file  . Years of education: Not on file  . Highest education level: Not on file  Occupational History  . Not on file  Tobacco Use  . Smoking status: Never Smoker  . Smokeless tobacco: Never Used  Vaping Use  . Vaping Use: Never used  Substance and Sexual Activity  . Alcohol use: No  . Drug use: Never  . Sexual activity: Not on file  Other Topics Concern  . Not on file  Social History Narrative  . Not on file   Social Determinants of Health   Financial Resource Strain:   . Difficulty of Paying Living  Expenses: Not on file  Food Insecurity:   . Worried About Programme researcher, broadcasting/film/video in the Last Year: Not on file  . Ran Out of Food in the Last Year: Not on file  Transportation Needs:   . Lack of Transportation (Medical): Not on file  . Lack of Transportation (Non-Medical): Not on file  Physical Activity:   . Days of Exercise per Week: Not on file  . Minutes of Exercise per Session: Not on file  Stress:   . Feeling of Stress : Not on file  Social Connections:   . Frequency of Communication with Friends and Family: Not on file  . Frequency of Social Gatherings with Friends and Family: Not on file  . Attends Religious Services: Not on file  . Active Member of Clubs or Organizations: Not on file  . Attends Banker Meetings: Not on file  . Marital Status: Not on file  Intimate Partner Violence:   . Fear of Current or Ex-Partner: Not on file  . Emotionally Abused: Not on file  . Physically Abused: Not on file  . Sexually Abused: Not on file    Allergies  Allergen Reactions  . Sulfa Antibiotics Itching    Current Outpatient Medications  Medication Sig Dispense Refill  . atorvastatin (LIPITOR) 20 MG tablet     . carvedilol (COREG) 25 MG tablet Take 25 mg  by mouth 2 (two) times daily.    . cholecalciferol (VITAMIN D) 1000 units tablet Vitamin D3 1,000 unit capsule  Take 1 capsule every day by oral route as directed for 90 days.    . cyclobenzaprine (FLEXERIL) 10 MG tablet     . fluticasone (FLONASE) 50 MCG/ACT nasal spray Place 1 spray into both nostrils daily as needed for allergies or rhinitis.     . furosemide (LASIX) 40 MG tablet Take 1 tablet (40 mg total) by mouth daily as needed for fluid. 30 tablet 0  . hydrochlorothiazide (HYDRODIURIL) 25 MG tablet Take 25 mg by mouth daily.    Marland Kitchen ibuprofen (ADVIL,MOTRIN) 200 MG tablet TAKE 1 TABLET BY MOUTH EVERY 6 HOURS AS NEEDED FOR PAIN CONTROL    . naproxen (NAPROSYN) 375 MG tablet     . quinapril (ACCUPRIL) 40 MG tablet Take  40 mg by mouth daily.    Marland Kitchen triamcinolone cream (KENALOG) 0.1 % APPLY ONE APPLICATION TOPICALLY DAILY AS NEEDED FOR IRRITATION    . ferrous sulfate 325 (65 FE) MG EC tablet Take 325 mg by mouth daily with breakfast.  (Patient not taking: Reported on 12/24/2019)    . metoprolol tartrate (LOPRESSOR) 25 MG tablet Take 25 mg by mouth daily as needed. For palpitations (Patient not taking: Reported on 12/24/2019)     No current facility-administered medications for this visit.    ROS:   General:  No weight loss, Fever, chills  HEENT: No recent headaches, no nasal bleeding, no visual changes, no sore throat  Neurologic: No dizziness, blackouts, seizures. No recent symptoms of stroke or mini- stroke. No recent episodes of slurred speech, or temporary blindness.  Cardiac: No recent episodes of chest pain/pressure, no shortness of breath at rest.  No shortness of breath with exertion.  Denies history of atrial fibrillation or irregular heartbeat  Vascular: No history of rest pain in feet.  No history of claudication.  No history of non-healing ulcer, No history of DVT   Pulmonary: No home oxygen, no productive cough, no hemoptysis,  + asthma or wheezing  Musculoskeletal:  [ ]  Arthritis, [ ]  Low back pain,  [ ]  Joint pain  Hematologic:No history of hypercoagulable state.  No history of easy bleeding.  No history of anemia  Gastrointestinal: No hematochezia or melena,  No gastroesophageal reflux, no trouble swallowing  Urinary: [ ]  chronic Kidney disease, [ ]  on HD - [ ]  MWF or [ ]  TTHS, [ ]  Burning with urination, [ ]  Frequent urination, [ ]  Difficulty urinating;   Skin: No rashes  Psychological: No history of anxiety,  No history of depression   Physical Examination  Vitals:   12/24/19 1330 12/24/19 1331  BP: 102/63 118/75  Pulse: 72   Resp: 20   Temp: 98 F (36.7 C)   SpO2: 96%   Weight: 241 lb (109.3 kg)   Height: 5\' 8"  (1.727 m)     Body mass index is 36.64 kg/m.  General:   Alert and oriented, no acute distress HEENT: Normal Neck: No JVD Cardiac: Regular Rate and Rhythm Skin: No rash Extremity Pulses:  2+ radial, brachial pulses bilaterally Musculoskeletal: No deformity or edema  Neurologic: Upper and lower extremity motor 5/5 and symmetric, no facial asymmetry  DATA:  Patient had a repeat carotid duplex today which again showed greater than 80% left internal carotid artery stenosis 40 to 60% right internal carotid artery stenosis antegrade vertebral flow  ASSESSMENT: Asymptomatic left internal carotid artery stenosis.  Discussed  the patient today risk benefits possible complications of left carotid endarterectomy.  I also discussed with her that her stroke risk for this lesion is about 5 to 10 %/year versus 1% stroke risk of carotid endarterectomy.  Other risks of the operation including but not limited to bleeding infection cardiac events cranial nerve injury were discussed.  She wishes to proceed.  She has follow-up scheduled with her cardiologist next week.  We will schedule her for her left carotid endarterectomy January 05, 2020 if she needs no further cardiac testing.  She was told to take aspirin 81 mg daily in addition to her statin.   PLAN: See above   Fabienne Bruns, MD [facility] Office: [phone]

## 2020-01-02 ENCOUNTER — Inpatient Hospital Stay (HOSPITAL_COMMUNITY): Admission: RE | Admit: 2020-01-02 | Payer: Medicare PPO | Source: Ambulatory Visit

## 2020-01-04 ENCOUNTER — Telehealth: Payer: Self-pay

## 2020-01-04 NOTE — Telephone Encounter (Signed)
Received call from Jan with preadmission inquiring if pt's surgery tomorrow has been cancelled. Spoke with pt who states her cardiologist Dr. Daryel November stated she needed cardiac clearance. She is scheduled for a cardiolite on 9/7, echo 9/8 and device check 9/16. Rescheduled pt for left carotid endarterectomy with Dr. Darrick Penna on 9/28. Pt verbalized understanding.

## 2020-01-04 NOTE — Progress Notes (Addendum)
Donna Tran has told [facility] and Pre- Surger y scheduler that she is not having surgery on 8/31, because she has to have more cardiac test before surgery.  I left a voice message on the nurse line at Dr. Darrick Penna office.  I called patient to see if she has had any testing done since we spoke with her last week, phone was not in service at that time.  I reached Donna Tran, she said cardiology appointment is in  Sept.  I asked if she had notified Dr. Darrick Penna office- she said no, I thought when [facility] they would tell the office.  I explained to Donna Tran that the surgeons office has to cancel surgeries, patient said she will notify office.

## 2020-01-22 ENCOUNTER — Telehealth: Payer: Self-pay

## 2020-01-22 NOTE — Telephone Encounter (Signed)
Spoke with pt who stated her cardiologist informed her after having Cardiolite and echo that her EF has decreased and concerns of blockages. Plans for LHC on 02/05/20. Pt will call back when ready schedule her Lt. CEA.

## 2020-01-27 ENCOUNTER — Inpatient Hospital Stay (HOSPITAL_COMMUNITY): Admission: RE | Admit: 2020-01-27 | Payer: Medicare PPO | Source: Ambulatory Visit

## 2020-01-30 ENCOUNTER — Other Ambulatory Visit (HOSPITAL_COMMUNITY): Payer: Medicare PPO

## 2020-02-01 ENCOUNTER — Inpatient Hospital Stay (HOSPITAL_COMMUNITY): Admission: RE | Admit: 2020-02-01 | Payer: Medicare PPO | Source: Home / Self Care | Admitting: Vascular Surgery

## 2020-02-01 SURGERY — ENDARTERECTOMY, CAROTID
Anesthesia: General | Laterality: Left

## 2020-06-24 ENCOUNTER — Telehealth: Payer: Self-pay

## 2020-06-24 NOTE — Telephone Encounter (Signed)
Called patient to follow up on CEA surgery appt in 12/2019. Patient says she has had multiple MRIs since then and does not want to proceed. She can follow up with [facility] if needed.
